# Patient Record
Sex: Male | Born: 1965 | Race: White | Hispanic: No | Smoking: Current every day smoker
Health system: Southern US, Community
[De-identification: ages and names within clinical notes are randomized; demographics above are authoritative.]

## PROBLEM LIST (undated history)

## (undated) DIAGNOSIS — K37 Unspecified appendicitis: Secondary | ICD-10-CM

## (undated) HISTORY — PX: KNEE ARTHROSCOPY: SUR90

## (undated) HISTORY — PX: APPENDECTOMY: SHX54

---

## 2014-01-07 ENCOUNTER — Emergency Department (HOSPITAL_BASED_OUTPATIENT_CLINIC_OR_DEPARTMENT_OTHER): Payer: Worker's Compensation

## 2014-01-07 ENCOUNTER — Encounter (HOSPITAL_BASED_OUTPATIENT_CLINIC_OR_DEPARTMENT_OTHER): Payer: Self-pay | Admitting: Emergency Medicine

## 2014-01-07 ENCOUNTER — Emergency Department (HOSPITAL_BASED_OUTPATIENT_CLINIC_OR_DEPARTMENT_OTHER)
Admission: EM | Admit: 2014-01-07 | Discharge: 2014-01-07 | Disposition: A | Discharge status: Home / Self Care | Payer: Worker's Compensation | Attending: Emergency Medicine | Admitting: Emergency Medicine

## 2014-01-07 DIAGNOSIS — S4980XA Other specified injuries of shoulder and upper arm, unspecified arm, initial encounter: Secondary | ICD-10-CM | POA: Insufficient documentation

## 2014-01-07 DIAGNOSIS — F17200 Nicotine dependence, unspecified, uncomplicated: Secondary | ICD-10-CM | POA: Insufficient documentation

## 2014-01-07 DIAGNOSIS — W010XXA Fall on same level from slipping, tripping and stumbling without subsequent striking against object, initial encounter: Secondary | ICD-10-CM | POA: Insufficient documentation

## 2014-01-07 DIAGNOSIS — M25512 Pain in left shoulder: Secondary | ICD-10-CM

## 2014-01-07 DIAGNOSIS — Y990 Civilian activity done for income or pay: Secondary | ICD-10-CM | POA: Insufficient documentation

## 2014-01-07 DIAGNOSIS — Z8719 Personal history of other diseases of the digestive system: Secondary | ICD-10-CM | POA: Insufficient documentation

## 2014-01-07 DIAGNOSIS — M25522 Pain in left elbow: Secondary | ICD-10-CM

## 2014-01-07 DIAGNOSIS — S6990XA Unspecified injury of unspecified wrist, hand and finger(s), initial encounter: Secondary | ICD-10-CM | POA: Insufficient documentation

## 2014-01-07 DIAGNOSIS — Y9289 Other specified places as the place of occurrence of the external cause: Secondary | ICD-10-CM | POA: Insufficient documentation

## 2014-01-07 DIAGNOSIS — S46909A Unspecified injury of unspecified muscle, fascia and tendon at shoulder and upper arm level, unspecified arm, initial encounter: Secondary | ICD-10-CM | POA: Insufficient documentation

## 2014-01-07 DIAGNOSIS — W19XXXA Unspecified fall, initial encounter: Secondary | ICD-10-CM

## 2014-01-07 DIAGNOSIS — S59919A Unspecified injury of unspecified forearm, initial encounter: Secondary | ICD-10-CM

## 2014-01-07 DIAGNOSIS — Y939 Activity, unspecified: Secondary | ICD-10-CM | POA: Insufficient documentation

## 2014-01-07 DIAGNOSIS — S59909A Unspecified injury of unspecified elbow, initial encounter: Secondary | ICD-10-CM | POA: Insufficient documentation

## 2014-01-07 HISTORY — DX: Unspecified appendicitis: K37

## 2014-01-07 MED ORDER — HYDROCODONE-ACETAMINOPHEN 5-325 MG PO TABS: 1.0000 | ORAL_TABLET | ORAL | Status: DC | PRN

## 2014-01-07 MED ORDER — IBUPROFEN 600 MG PO TABS: 600.0000 mg | ORAL_TABLET | Freq: Three times a day (TID) | ORAL | Status: DC | PRN

## 2014-01-07 MED ORDER — IBUPROFEN 200 MG PO TABS
600.0000 mg | ORAL_TABLET | Freq: Once | ORAL | Status: AC
  Administered 2014-01-07: 600 mg via ORAL
  Filled 2014-01-07 (×2): qty 1

## 2014-01-07 NOTE — ED Notes (Signed)
Pt works for [employer]. Was walking outside and slipped on ice, grabbed a lever with left arm, fell, and injured left arm. Pt reports pain in left clavicle area that radiates down left arm. Pt able to lift left arm to heart length with little difficulty. Pt alert and oriented x 4, neuro intact. Pt rates pain 5/10 when moving arm.

## 2014-01-07 NOTE — ED Notes (Signed)
Was falling and grab a bar to brake fall  inj to left upper and shoulder

## 2014-01-07 NOTE — ED Provider Notes (Signed)
CSN: [id number]     Arrival date & time 01/07/14  0354 History   First MD Initiated Contact with Patient 01/07/14 0359     Chief Complaint  Patient presents with  . Arm Pain     The history is provided by the patient.   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  He slipped on possible ice this evening resulting in a fall which point he attempted to catch himself with his left arm.  He states pain in his left elbow and pain in his left shoulder this time.  He can move his arm.  He denies head injury or neck pain.  No other complaints.  He denies knee or hip pain.  His pain is mild to moderate in severity at this time  Past Medical History  Diagnosis Date  . Appendicitis    Past Surgical History  Procedure Laterality Date  . Appendectomy    . Knee arthroscopy Right x 2   No family history on file. History  Substance Use Topics  . Smoking status: Current Every Day Smoker  . Smokeless tobacco: Not on file  . Alcohol Use: Not on file    Review of Systems  All other systems reviewed and are negative.      Allergies  Review of patient's allergies indicates no known allergies.  Home Medications   Current Outpatient Rx  Name  Route  Sig  Dispense  Refill  . HYDROcodone-acetaminophen (NORCO/VICODIN) 5-325 MG per tablet   Oral   Take 1 tablet by mouth every 4 (four) hours as needed for moderate pain.   15 tablet   0   . ibuprofen (ADVIL,MOTRIN) 600 MG tablet   Oral   Take 1 tablet (600 mg total) by mouth every 8 (eight) hours as needed.   15 tablet   0    BP 140/77  Pulse 73  Temp(Src) 98.5 F (36.9 C) (Oral)  Resp 18  Ht 5\' 8"  (1.727 m)  Wt 195 lb (88.451 kg)  BMI 29.66 kg/m2  SpO2 99% Physical Exam  Nursing note and vitals reviewed. Constitutional: He is oriented to person, place, and time. He appears well-developed and well-nourished.  HENT:  Head: Normocephalic and atraumatic.  Eyes: EOM are normal.  Neck: Normal range of motion.  Cardiovascular:  Normal rate.   Pulmonary/Chest: Effort normal.  Abdominal: Soft.  Musculoskeletal: Normal range of motion.  Full range of motion of left wrist.  Normal left radial pulse.  Patient with mild tenderness of his left radial head.  Mild pain located at the level of the elbow with supination of his left hand.  Patient with tenderness of his left a.c. joint without obvious deformity.  Mild pain with range of motion of his left shoulder.  No obvious clavicular abnormalities.  Neurological: He is alert and oriented to person, place, and time.  Skin: Skin is warm and dry.  Psychiatric: He has a normal mood and affect. Judgment normal.    ED Course  Procedures (including critical care time) Labs Review Labs Reviewed - No data to display Imaging Review Dg Elbow Complete Left  01/07/2014   CLINICAL DATA:  Slipped on ice; left arm pain.  EXAM: LEFT ELBOW - COMPLETE 3+ VIEW  COMPARISON:  None.  FINDINGS: There is no evidence of fracture or dislocation. Visualized joint spaces are preserved. A small osseous fragment overlying the olecranon may reflect remote injury or degenerative change. No elbow joint effusion is identified. The visualized soft tissues are  grossly unremarkable in appearance.  IMPRESSION: No evidence of fracture or dislocation.   Electronically Signed   By: Roanna RaiderJeffery  Chang M.D.   On: 01/07/2014 04:47   Dg Shoulder Left  01/07/2014   CLINICAL DATA:  Slipped on ice; left arm pain.  EXAM: LEFT SHOULDER - 2+ VIEW  COMPARISON:  None.  FINDINGS: There is no evidence of fracture or dislocation. The left humeral head is seated within the glenoid fossa. The acromioclavicular joint is unremarkable in appearance. No significant soft tissue abnormalities are seen. The visualized portions of the left lung are clear.  IMPRESSION: No evidence of fracture or dislocation.   Electronically Signed   By: Roanna RaiderJeffery  Chang M.D.   On: 01/07/2014 04:47  I personally reviewed the imaging tests through PACS system I  reviewed available ER/hospitalization records through the EMR   EKG Interpretation   None       MDM   Final diagnoses:  Left shoulder pain  Left elbow pain  Fall    Sports medicine follow up. Pain meds, nsaids. PRN shoulder sling    Lyanne CoKevin M Gargi Berch, MD 01/07/14 [phone]

## 2014-01-07 NOTE — ED Notes (Signed)
UDS Performed on Pt

## 2014-01-07 NOTE — ED Notes (Signed)
Patient transported to X-ray 

## 2014-01-09 ENCOUNTER — Ambulatory Visit (INDEPENDENT_AMBULATORY_CARE_PROVIDER_SITE_OTHER): Payer: Worker's Compensation | Admitting: Family Medicine

## 2014-01-09 ENCOUNTER — Encounter: Payer: Self-pay | Admitting: Family Medicine

## 2014-01-09 VITALS — BP 143/84 | HR 69 | Ht 68.0 in | Wt 192.0 lb

## 2014-01-09 DIAGNOSIS — M25512 Pain in left shoulder: Secondary | ICD-10-CM

## 2014-01-09 DIAGNOSIS — S4992XA Unspecified injury of left shoulder and upper arm, initial encounter: Secondary | ICD-10-CM

## 2014-01-09 DIAGNOSIS — S4980XA Other specified injuries of shoulder and upper arm, unspecified arm, initial encounter: Secondary | ICD-10-CM

## 2014-01-09 DIAGNOSIS — S46909A Unspecified injury of unspecified muscle, fascia and tendon at shoulder and upper arm level, unspecified arm, initial encounter: Secondary | ICD-10-CM

## 2014-01-09 DIAGNOSIS — M25519 Pain in unspecified shoulder: Secondary | ICD-10-CM

## 2014-01-09 MED ORDER — HYDROCODONE-ACETAMINOPHEN 5-325 MG PO TABS: 1.0000 | ORAL_TABLET | Freq: Four times a day (QID) | ORAL | Status: DC | PRN

## 2014-01-09 MED ORDER — MELOXICAM 15 MG PO TABS: 15.0000 mg | ORAL_TABLET | Freq: Every day | ORAL | Status: AC

## 2014-01-09 NOTE — Patient Instructions (Signed)
You have strained your rotator cuff. Try to avoid painful activities (overhead activities, lifting with extended arm) as much as possible. Meloxicam 15 mg daily with food for pain and inflammation (can start with half a tablet instead to make sure it doesn't irritate your stomach). Norco as needed for severe pain - we do not refill this medication. Can take tylenol in addition to this. Start physical therapy with transition to home exercise program. Light duty until follow-up in 2 weeks. If not improving at follow-up we will consider further imaging (MRI).

## 2014-01-13 ENCOUNTER — Encounter: Payer: Self-pay | Admitting: Family Medicine

## 2014-01-13 DIAGNOSIS — S4992XA Unspecified injury of left shoulder and upper arm, initial encounter: Secondary | ICD-10-CM | POA: Insufficient documentation

## 2014-01-13 NOTE — Assessment & Plan Note (Signed)
consistent with rotator cuff strain.  Meloxicam with norco as needed for severe pain.  Start physical therapy and home exercise program.  Light duty with follow-up in 2 weeks.  Consider MRI if not improving.

## 2014-01-13 NOTE — Progress Notes (Signed)
Patient ID: Brett Patel, male   DOB: 1966/11/10, 48 y.o.   MRN: [id number]  PCP: No PCP Per Patient  Subjective:   HPI: Patient is a 48 y.o. male here for left shoulder injury.  Patient reports at work 2 days ago 2/21 he went to open a trailer door and slipped on the ice. Fell onto left side onto wrist then shoulder. Difficulty lifting arm now. Is left handed. No swelling, bruising. + night pain. Taking ibuprofen and hydrocodone.  Past Medical History  Diagnosis Date  . Appendicitis     No current outpatient prescriptions on file prior to visit.   No current facility-administered medications on file prior to visit.    Past Surgical History  Procedure Laterality Date  . Appendectomy    . Knee arthroscopy Right x 2    No Known Allergies  History   Social History  . Marital Status: Married    Spouse Name: N/A    Number of Children: N/A  . Years of Education: N/A   Occupational History  . Not on file.   Social History Main Topics  . Smoking status: Current Every Day Smoker  . Smokeless tobacco: Not on file  . Alcohol Use: Not on file  . Drug Use: Not on file  . Sexual Activity: Not on file   Other Topics Concern  . Not on file   Social History Narrative  . No narrative on file    Family History  Problem Relation Age of Onset  . Heart attack Father   . Diabetes Sister   . Hyperlipidemia Brother     BP 143/84  Pulse 69  Ht 5\' 8"  (1.727 m)  Wt 192 lb (87.091 kg)  BMI 29.20 kg/m2  Review of Systems: See HPI above.    Objective:  Physical Exam:  Gen: NAD  Left shoulder: No swelling, ecchymoses.  No gross deformity. No TTP. Full ER.  Abduction to 90 degrees, flexion to 100 degrees, painful. Positive Hawkins, Neers. Negative Yergasons. Strength 4/5 with empty can and 5/5 resisted internal/external rotation.  Pain empty can > IR Negative apprehension. NV intact distally.    Assessment & Plan:  1. Left shoulder injury - consistent with  rotator cuff strain.  Meloxicam with norco as needed for severe pain.  Start physical therapy and home exercise program.  Light duty with follow-up in 2 weeks.  Consider MRI if not improving.

## 2014-01-17 ENCOUNTER — Ambulatory Visit: Payer: Worker's Compensation | Attending: Family Medicine | Admitting: Physical Therapy

## 2014-01-17 DIAGNOSIS — IMO0001 Reserved for inherently not codable concepts without codable children: Secondary | ICD-10-CM | POA: Insufficient documentation

## 2014-01-17 DIAGNOSIS — M25519 Pain in unspecified shoulder: Secondary | ICD-10-CM | POA: Insufficient documentation

## 2014-01-20 ENCOUNTER — Ambulatory Visit: Payer: Worker's Compensation | Admitting: Physical Therapy

## 2014-01-23 ENCOUNTER — Ambulatory Visit: Payer: Self-pay | Admitting: Family Medicine

## 2014-01-24 ENCOUNTER — Encounter: Payer: Self-pay | Admitting: Family Medicine

## 2014-01-24 ENCOUNTER — Ambulatory Visit (INDEPENDENT_AMBULATORY_CARE_PROVIDER_SITE_OTHER): Payer: Worker's Compensation | Admitting: Family Medicine

## 2014-01-24 ENCOUNTER — Ambulatory Visit: Payer: Worker's Compensation | Admitting: Physical Therapy

## 2014-01-24 VITALS — BP 115/76 | HR 68 | Ht 68.0 in | Wt 190.0 lb

## 2014-01-24 DIAGNOSIS — S4992XA Unspecified injury of left shoulder and upper arm, initial encounter: Secondary | ICD-10-CM

## 2014-01-24 DIAGNOSIS — S4980XA Other specified injuries of shoulder and upper arm, unspecified arm, initial encounter: Secondary | ICD-10-CM

## 2014-01-24 DIAGNOSIS — S46909A Unspecified injury of unspecified muscle, fascia and tendon at shoulder and upper arm level, unspecified arm, initial encounter: Secondary | ICD-10-CM

## 2014-01-25 ENCOUNTER — Encounter: Payer: Self-pay | Admitting: Family Medicine

## 2014-01-25 NOTE — Progress Notes (Signed)
Patient ID: Brett Patel, male   DOB: Jun 03, 1966, 48 y.o.   MRN: [id number]  PCP: No PCP Per Patient  Subjective:   HPI: Patient is a 48 y.o. male here for left shoulder injury.  2/23: Patient reports at work 2 days ago 2/21 he went to open a trailer door and slipped on the ice. Fell onto left side onto wrist then shoulder. Difficulty lifting arm now. Is left handed. No swelling, bruising. + night pain. Taking ibuprofen and hydrocodone.  3/10: Patient reports he is significantly better. Has done 4 visits of PT. Motion much improved. Doing home exercises. No pain currently.  Past Medical History  Diagnosis Date  . Appendicitis     Current Outpatient Prescriptions on File Prior to Visit  Medication Sig Dispense Refill  . escitalopram (LEXAPRO) 10 MG tablet Take 10 mg by mouth daily.      Marland Kitchen. HYDROcodone-acetaminophen (NORCO/VICODIN) 5-325 MG per tablet Take 1 tablet by mouth every 6 (six) hours as needed for moderate pain.  40 tablet  0  . lithium carbonate 150 MG capsule Take 150 mg by mouth 3 (three) times daily with meals.      . meloxicam (MOBIC) 15 MG tablet Take 1 tablet (15 mg total) by mouth daily. With food.  30 tablet  1   No current facility-administered medications on file prior to visit.    Past Surgical History  Procedure Laterality Date  . Appendectomy    . Knee arthroscopy Right x 2    No Known Allergies  History   Social History  . Marital Status: Married    Spouse Name: N/A    Number of Children: N/A  . Years of Education: N/A   Occupational History  . Not on file.   Social History Main Topics  . Smoking status: Current Every Day Smoker  . Smokeless tobacco: Not on file  . Alcohol Use: Not on file  . Drug Use: Not on file  . Sexual Activity: Not on file   Other Topics Concern  . Not on file   Social History Narrative  . No narrative on file    Family History  Problem Relation Age of Onset  . Heart attack Father   . Diabetes  Sister   . Hyperlipidemia Brother     BP 115/76  Pulse 68  Ht 5\' 8"  (1.727 m)  Wt 190 lb (86.183 kg)  BMI 28.90 kg/m2  Review of Systems: See HPI above.    Objective:  Physical Exam:  Gen: NAD  Left shoulder: No swelling, ecchymoses.  No gross deformity. No TTP. FROM without pain. Negative Hawkins, Neers. Negative Yergasons. Strength 5/5 with empty can and 5/5 resisted internal/external rotation.  No pain. Negative apprehension. NV intact distally.    Assessment & Plan:  1. Left shoulder injury - consistent with rotator cuff strain.  Significantly improved.  Continue HEP another 4 weeks.  Return to full duty after last PT visit Thursday (can return on Friday to work).  F/u prn.

## 2014-01-25 NOTE — Assessment & Plan Note (Signed)
consistent with rotator cuff strain.  Significantly improved.  Continue HEP another 4 weeks.  Return to full duty after last PT visit Thursday (can return on Friday to work).  F/u prn.

## 2014-01-26 ENCOUNTER — Ambulatory Visit: Payer: Worker's Compensation | Admitting: Physical Therapy

## 2014-01-30 ENCOUNTER — Ambulatory Visit: Payer: Worker's Compensation | Admitting: Physical Therapy

## 2014-02-01 ENCOUNTER — Ambulatory Visit: Payer: Worker's Compensation | Admitting: Physical Therapy

## 2014-02-06 ENCOUNTER — Ambulatory Visit: Payer: Worker's Compensation | Admitting: Physical Therapy

## 2014-02-09 ENCOUNTER — Encounter: Payer: Self-pay | Admitting: Physical Therapy

## 2014-05-06 ENCOUNTER — Ambulatory Visit (HOSPITAL_COMMUNITY)
Admission: AD | Admit: 2014-05-06 | Discharge: 2014-05-06 | Disposition: A | Discharge status: Home / Self Care | Payer: 59 | Attending: Psychiatry | Admitting: Psychiatry

## 2014-05-06 DIAGNOSIS — F319 Bipolar disorder, unspecified: Secondary | ICD-10-CM | POA: Insufficient documentation

## 2014-05-06 DIAGNOSIS — F17200 Nicotine dependence, unspecified, uncomplicated: Secondary | ICD-10-CM | POA: Insufficient documentation
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
## 2014-07-15 IMAGING — CR DG SHOULDER 2+V*L*
3 series · 3 of 3 positions shown · non-contrast
Comparison: None.

CLINICAL DATA: Slipped on ice; left arm pain.

EXAM:
LEFT SHOULDER - 2+ VIEW

[w shoulder ap internal left]
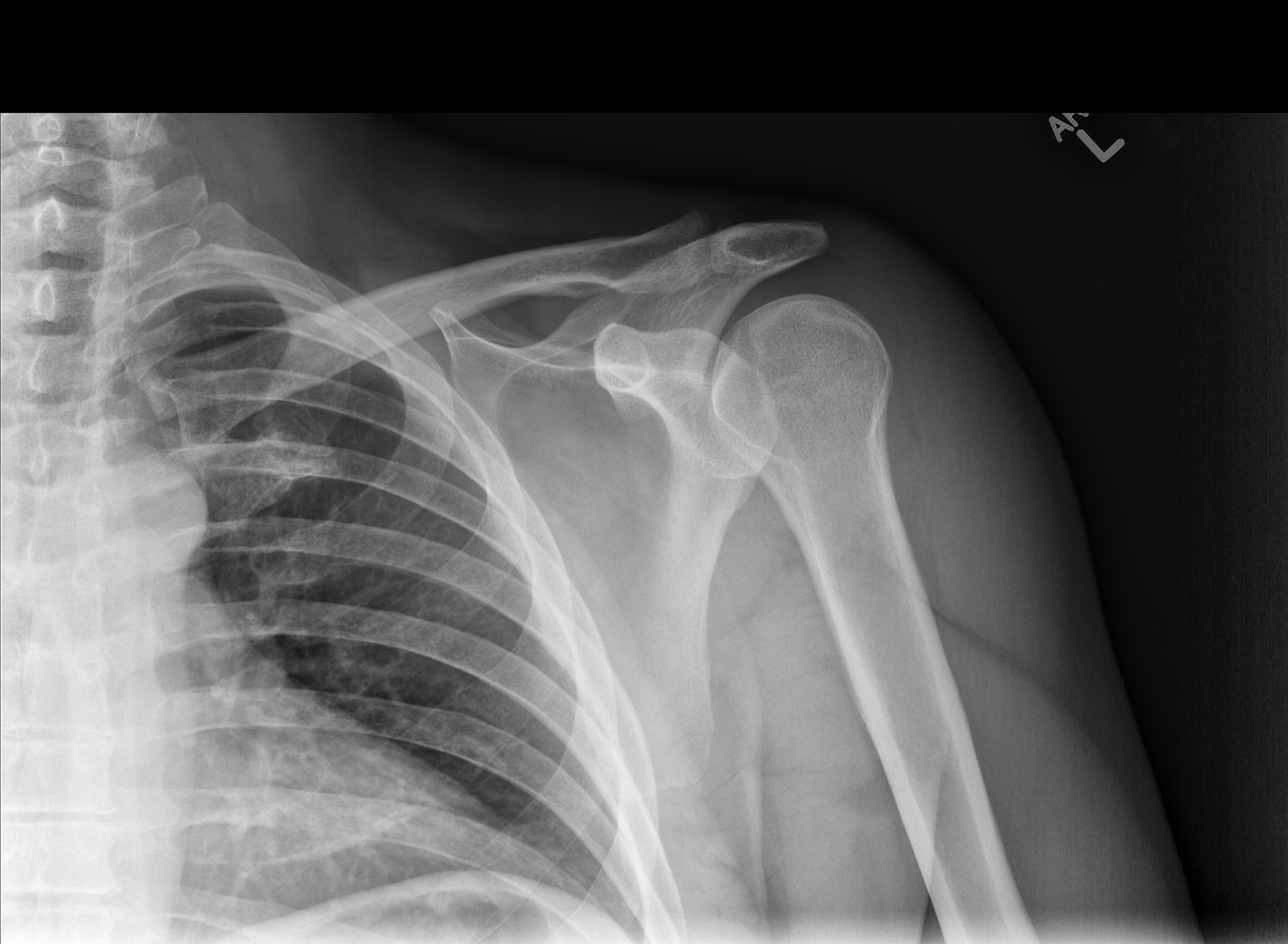

[w shoulder y view left]
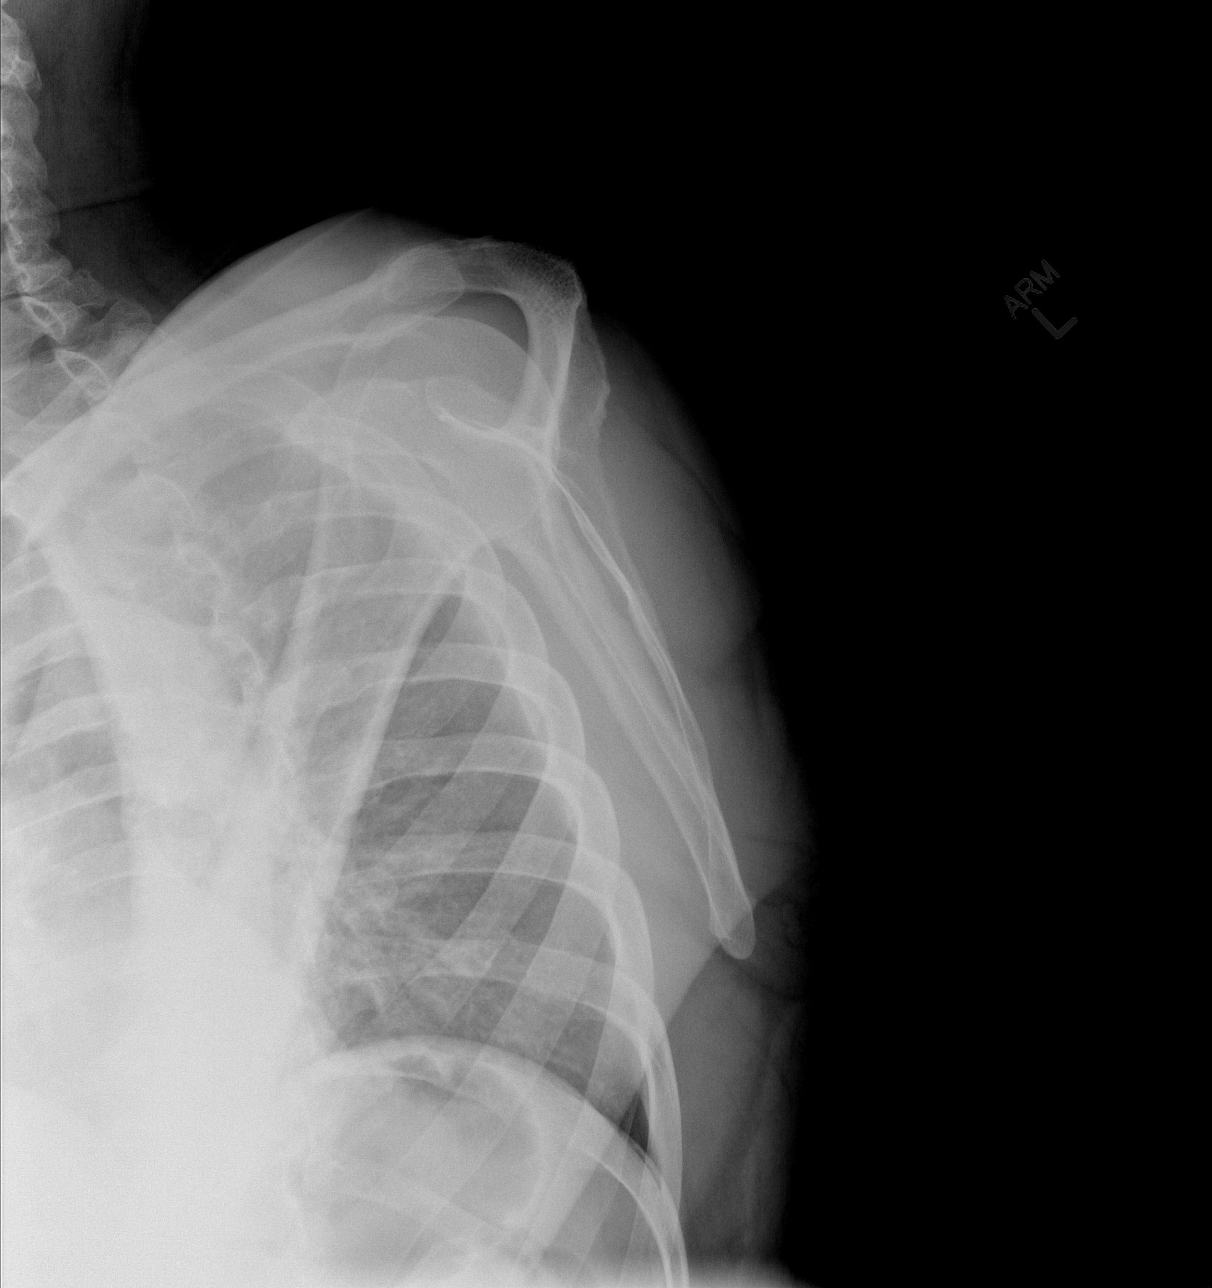

[x shoulder axillary left]
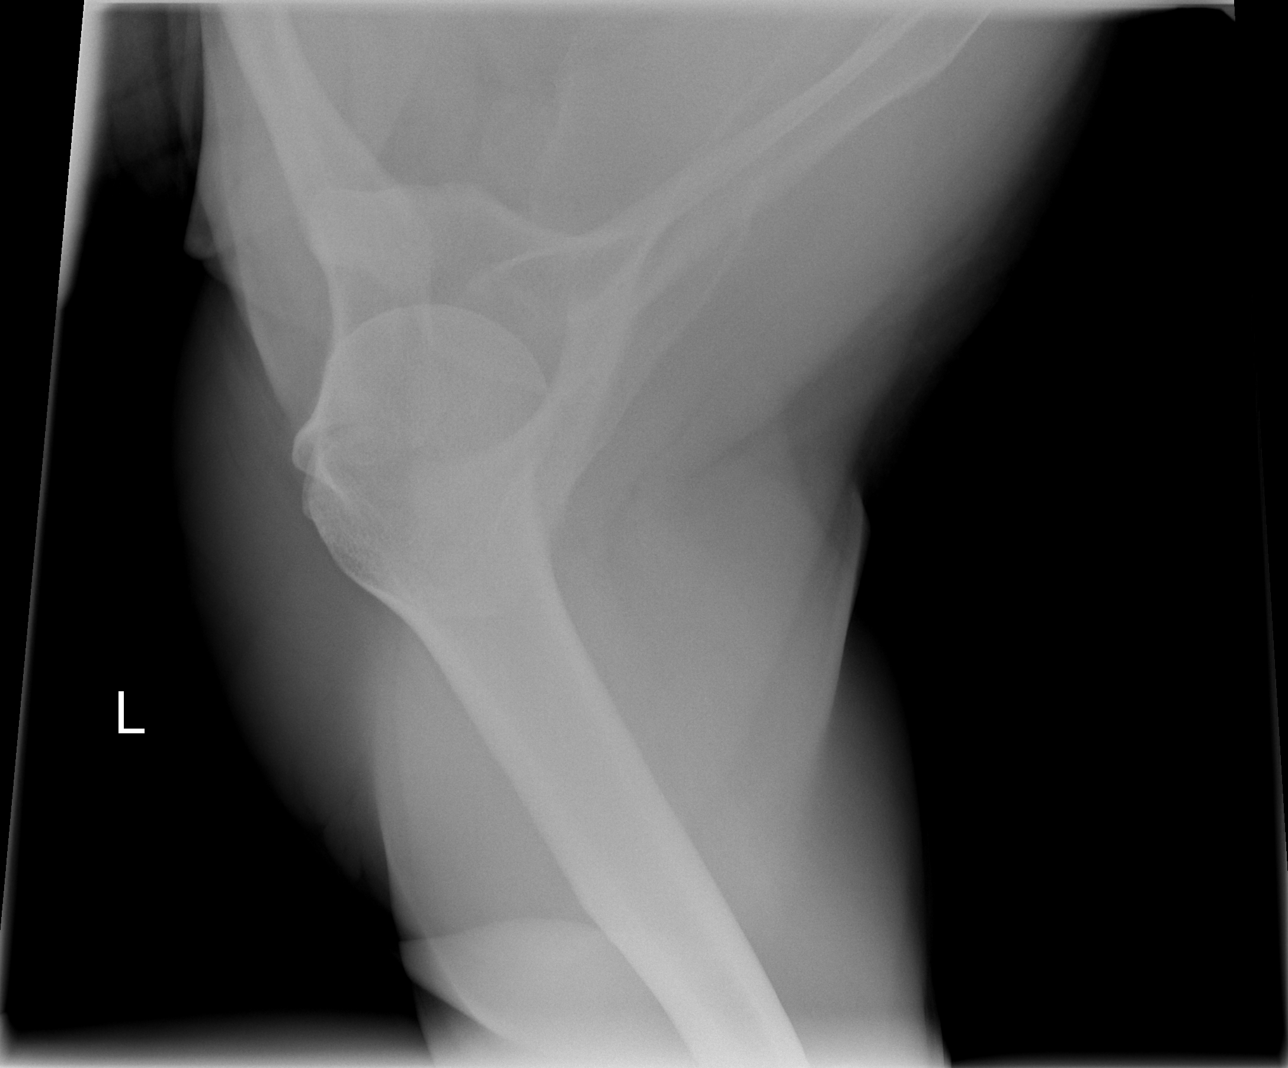

[3 of 3 positions shown; findings below may reference images not displayed]

FINDINGS: There is no evidence of fracture or dislocation. The left humeral
head is seated within the glenoid fossa. The acromioclavicular joint
is unremarkable in appearance. No significant soft tissue
abnormalities are seen. The visualized portions of the left lung are
clear.
IMPRESSION: No evidence of fracture or dislocation.

## 2014-07-15 IMAGING — CR DG ELBOW COMPLETE 3+V*L*
4 series · 4 of 4 positions shown · non-contrast
Comparison: None.

CLINICAL DATA: Slipped on ice; left arm pain.

EXAM:
LEFT ELBOW - COMPLETE 3+ VIEW

[x elbow joint ap left]
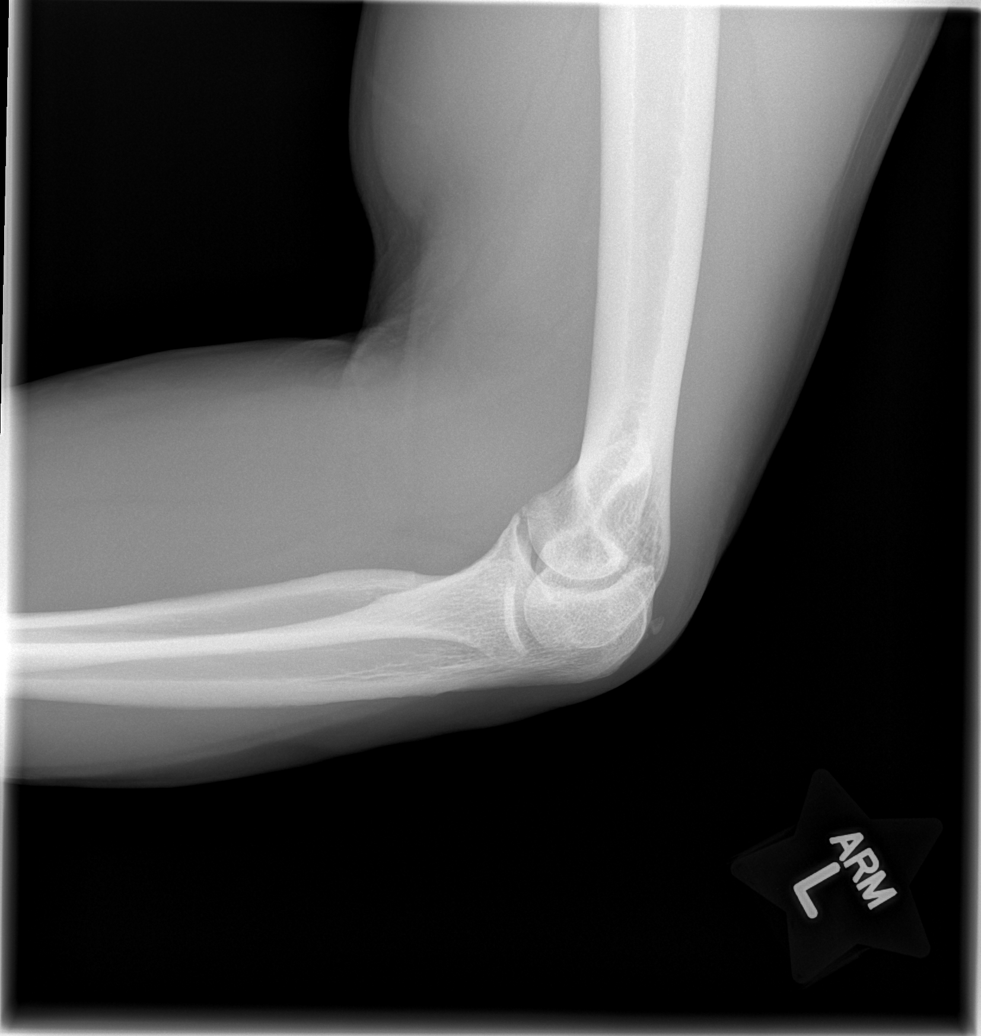

[x elbow joint obl. left (1 of 2)]
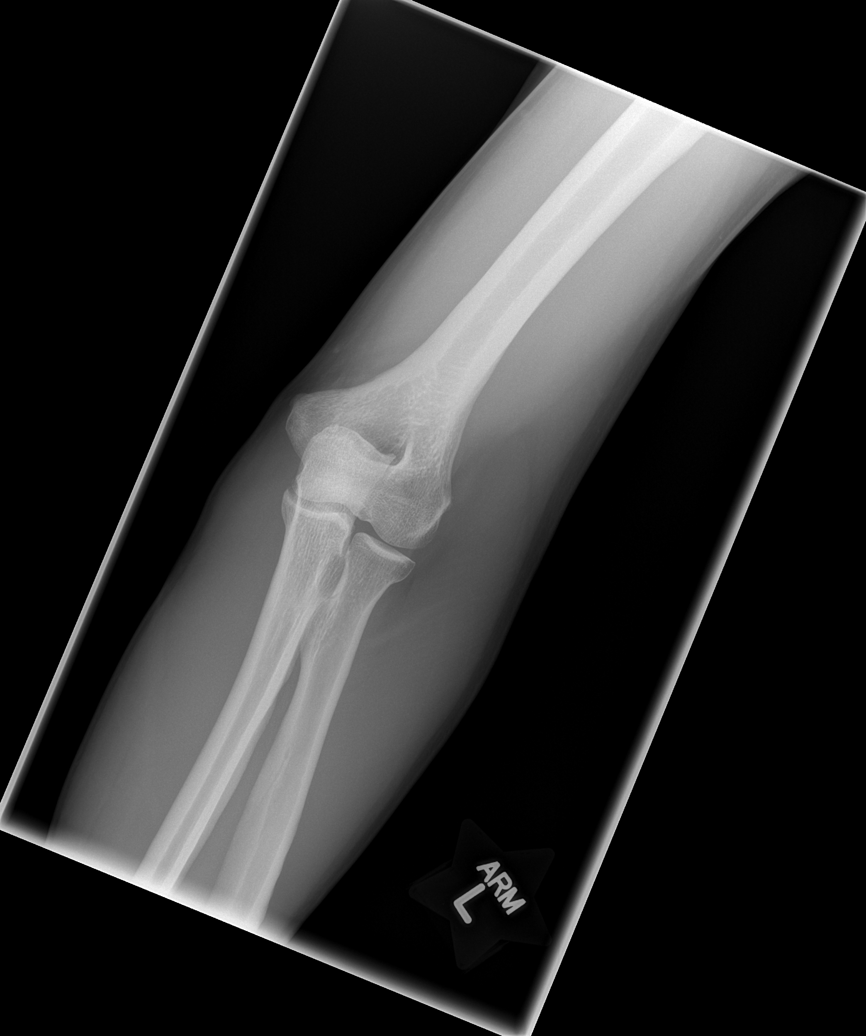

[x elbow joint obl. left (2 of 2)]
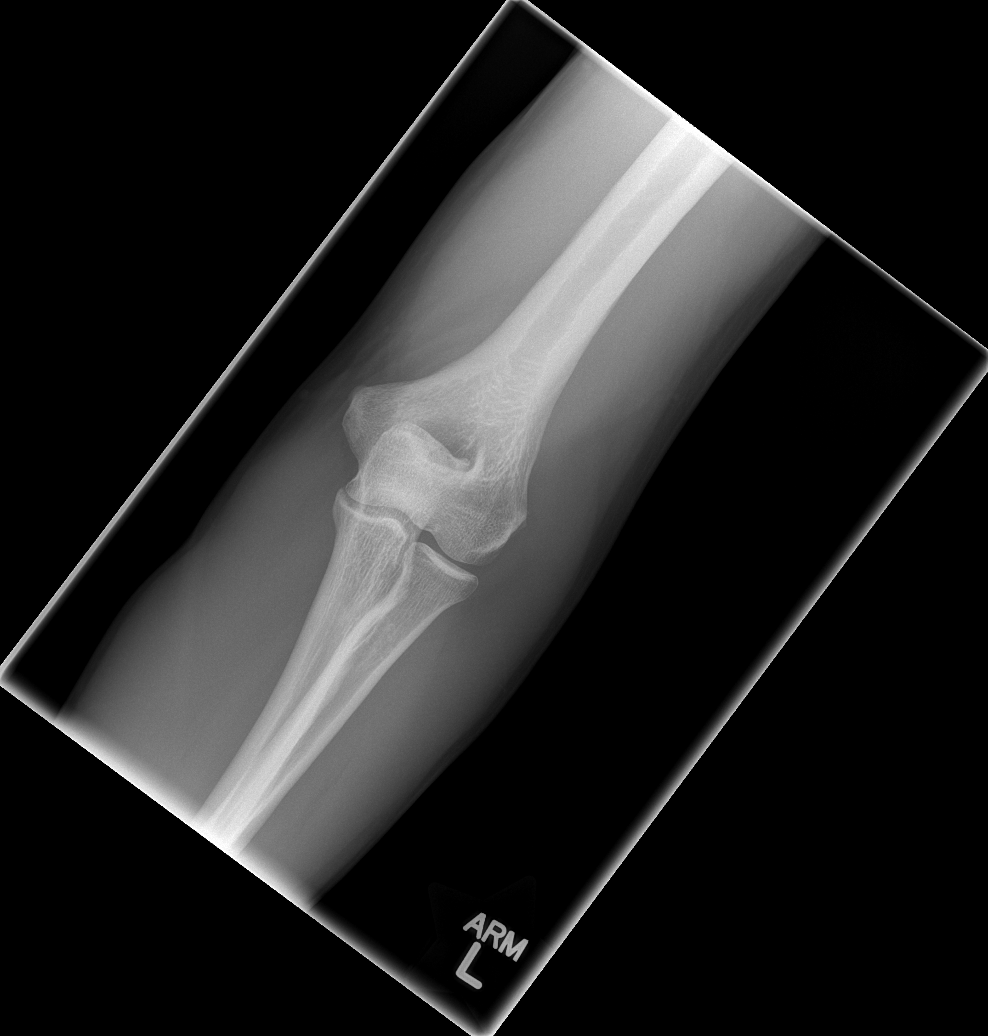

[x elbow joint lat left]
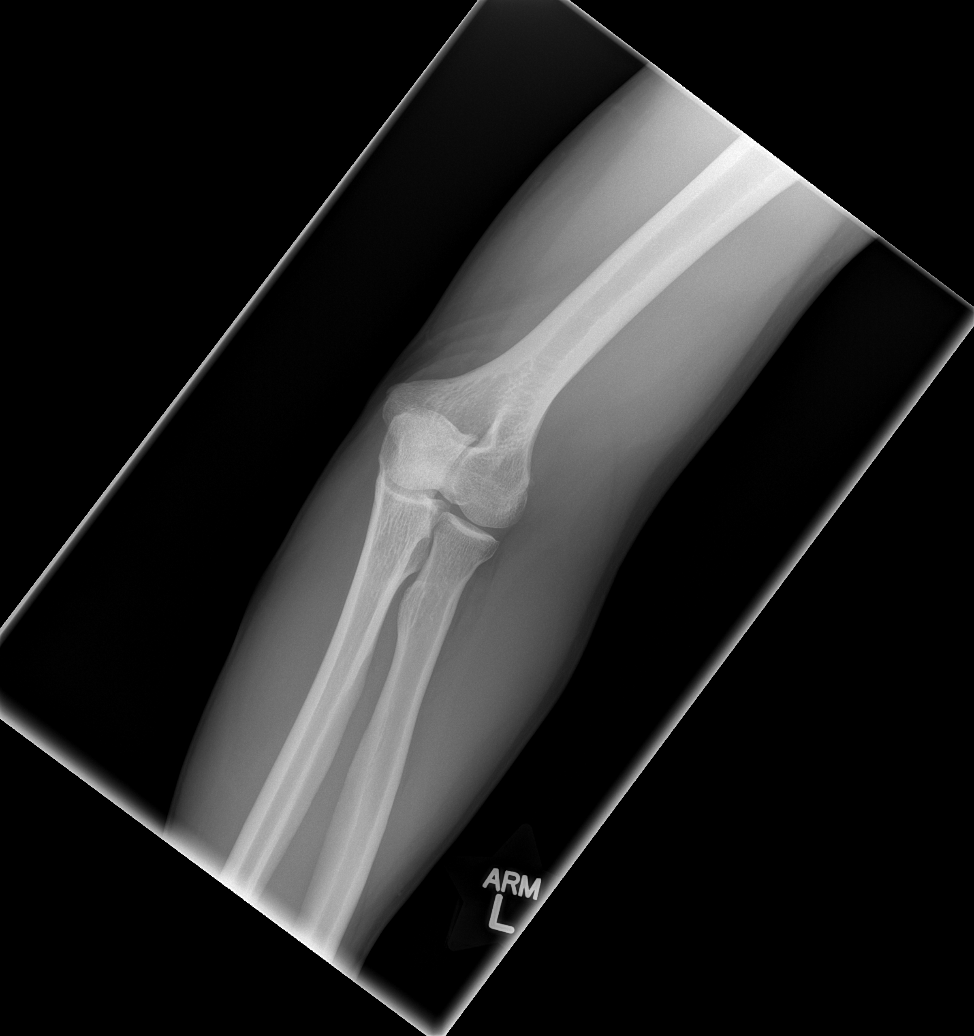

[4 of 4 positions shown; findings below may reference images not displayed]

FINDINGS: There is no evidence of fracture or dislocation. Visualized joint
spaces are preserved. A small osseous fragment overlying the
olecranon may reflect remote injury or degenerative change. No elbow
joint effusion is identified. The visualized soft tissues are
grossly unremarkable in appearance.
IMPRESSION: No evidence of fracture or dislocation.

## 2016-04-06 ENCOUNTER — Emergency Department (HOSPITAL_BASED_OUTPATIENT_CLINIC_OR_DEPARTMENT_OTHER)
Admission: EM | Admit: 2016-04-06 | Discharge: 2016-04-06 | Disposition: A | Discharge status: Home / Self Care | Payer: 59 | Attending: Emergency Medicine | Admitting: Emergency Medicine

## 2016-04-06 ENCOUNTER — Encounter (HOSPITAL_BASED_OUTPATIENT_CLINIC_OR_DEPARTMENT_OTHER): Payer: Self-pay

## 2016-04-06 DIAGNOSIS — F17200 Nicotine dependence, unspecified, uncomplicated: Secondary | ICD-10-CM | POA: Diagnosis not present

## 2016-04-06 DIAGNOSIS — M5441 Lumbago with sciatica, right side: Secondary | ICD-10-CM | POA: Diagnosis not present

## 2016-04-06 DIAGNOSIS — R110 Nausea: Secondary | ICD-10-CM | POA: Diagnosis not present

## 2016-04-06 DIAGNOSIS — M25512 Pain in left shoulder: Secondary | ICD-10-CM

## 2016-04-06 DIAGNOSIS — M545 Low back pain: Secondary | ICD-10-CM | POA: Diagnosis present

## 2016-04-06 MED ORDER — METHOCARBAMOL 500 MG PO TABS: 500.0000 mg | ORAL_TABLET | Freq: Two times a day (BID) | ORAL | Status: AC

## 2016-04-06 MED ORDER — KETOROLAC TROMETHAMINE 15 MG/ML IJ SOLN
15.0000 mg | Freq: Once | INTRAMUSCULAR | Status: AC
  Administered 2016-04-06: 15 mg via INTRAMUSCULAR
  Filled 2016-04-06: qty 1

## 2016-04-06 MED ORDER — HYDROCODONE-ACETAMINOPHEN 5-325 MG PO TABS: 1.0000 | ORAL_TABLET | Freq: Four times a day (QID) | ORAL | Status: AC | PRN

## 2016-04-06 NOTE — ED Notes (Signed)
Pt reports R side back pain radiating down R leg x 1 week. 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 Pt has taken ibuprofen without relief. Pt had a fall last Wednesday, unsure if it is related.

## 2016-04-06 NOTE — ED Notes (Signed)
Pt reports lower back pain, radiating down right leg. Reports fall on Wednesday with no injury.

## 2016-04-06 NOTE — Discharge Instructions (Signed)
Medications: Norco, Robaxin  Treatment: Take Norco every 6 hours as needed for severe pain. Take Robaxin twice daily as needed for muscle pain and spasm. Do not drive or operate machinery while taking this medication. Use moist heat 3-4 times daily alternating 20 minutes on, 20 minutes off. Do the back exercises and stretches indicated below as tolerated.  Follow-up: Please follow-up with the spine specialist as indicated in your discharge paperwork for further evaluation and treatment of your symptoms. Please follow-up with her primary care provider as needed. Please return to emergency department if you develop any new or worsening symptoms.    Back Exercises The following exercises strengthen the muscles that help to support the back. They also help to keep the lower back flexible. Doing these exercises can help to prevent back pain or lessen existing pain. If you have back pain or discomfort, try doing these exercises 2-3 times each day or as told by your health care provider. When the pain goes away, do them once each day, but increase the number of times that you repeat the steps for each exercise (do more repetitions). If you do not have back pain or discomfort, do these exercises once each day or as told by your health care provider. EXERCISES Single Knee to Chest Repeat these steps 3-5 times for each leg: 1. Lie on your back on a firm bed or the floor with your legs extended. 2. Bring one knee to your chest. Your other leg should stay extended and in contact with the floor. 3. Hold your knee in place by grabbing your knee or thigh. 4. Pull on your knee until you feel a gentle stretch in your lower back. 5. Hold the stretch for 10-30 seconds. 6. Slowly release and straighten your leg. Pelvic Tilt Repeat these steps 5-10 times: 1. Lie on your back on a firm bed or the floor with your legs extended. 2. Bend your knees so they are pointing toward the ceiling and your feet are flat on the  floor. 3. Tighten your lower abdominal muscles to press your lower back against the floor. This motion will tilt your pelvis so your tailbone points up toward the ceiling instead of pointing to your feet or the floor. 4. With gentle tension and even breathing, hold this position for 5-10 seconds. Cat-Cow Repeat these steps until your lower back becomes more flexible: 1. Get into a hands-and-knees position on a firm surface. Keep your hands under your shoulders, and keep your knees under your hips. You may place padding under your knees for comfort. 2. Let your head hang down, and point your tailbone toward the floor so your lower back becomes rounded like the back of a cat. 3. Hold this position for 5 seconds. 4. Slowly lift your head and point your tailbone up toward the ceiling so your back forms a sagging arch like the back of a cow. 5. Hold this position for 5 seconds. Press-Ups Repeat these steps 5-10 times: 1. Lie on your abdomen (face-down) on the floor. 2. Place your palms near your head, about shoulder-width apart. 3. While you keep your back as relaxed as possible and keep your hips on the floor, slowly straighten your arms to raise the top half of your body and lift your shoulders. Do not use your back muscles to raise your upper torso. You may adjust the placement of your hands to make yourself more comfortable. 4. Hold this position for 5 seconds while you keep your back relaxed.  5. Slowly return to lying flat on the floor. Bridges Repeat these steps 10 times: 1. Lie on your back on a firm surface. 2. Bend your knees so they are pointing toward the ceiling and your feet are flat on the floor. 3. Tighten your buttocks muscles and lift your buttocks off of the floor until your waist is at almost the same height as your knees. You should feel the muscles working in your buttocks and the back of your thighs. If you do not feel these muscles, slide your feet 1-2 inches farther away from  your buttocks. 4. Hold this position for 3-5 seconds. 5. Slowly lower your hips to the starting position, and allow your buttocks muscles to relax completely. If this exercise is too easy, try doing it with your arms crossed over your chest. Abdominal Crunches Repeat these steps 5-10 times: 1. Lie on your back on a firm bed or the floor with your legs extended. 2. Bend your knees so they are pointing toward the ceiling and your feet are flat on the floor. 3. Cross your arms over your chest. 4. Tip your chin slightly toward your chest without bending your neck. 5. Tighten your abdominal muscles and slowly raise your trunk (torso) high enough to lift your shoulder blades a tiny bit off of the floor. Avoid raising your torso higher than that, because it can put too much stress on your low back and it does not help to strengthen your abdominal muscles. 6. Slowly return to your starting position. Back Lifts Repeat these steps 5-10 times: 1. Lie on your abdomen (face-down) with your arms at your sides, and rest your forehead on the floor. 2. Tighten the muscles in your legs and your buttocks. 3. Slowly lift your chest off of the floor while you keep your hips pressed to the floor. Keep the back of your head in line with the curve in your back. Your eyes should be looking at the floor. 4. Hold this position for 3-5 seconds. 5. Slowly return to your starting position. SEEK MEDICAL CARE IF:  Your back pain or discomfort gets much worse when you do an exercise.  Your back pain or discomfort does not lessen within 2 hours after you exercise. If you have any of these problems, stop doing these exercises right away. Do not do them again unless your health care provider says that you can. SEEK IMMEDIATE MEDICAL CARE IF:  You develop sudden, severe back pain. If this happens, stop doing the exercises right away. Do not do them again unless your health care provider says that you can.   This information  is not intended to replace advice given to you by your health care provider. Make sure you discuss any questions you have with your health care provider.   Document Released: 12/11/2004 Document Revised: 07/25/2015 Document Reviewed: 12/28/2014 Elsevier Interactive Patient Education [employer]

## 2016-04-06 NOTE — ED Provider Notes (Signed)
CSN: [id number]     Arrival date & time 04/06/16  1522 History  By signing my name below, I, Ronney Lion, attest that this documentation has been prepared under the direction and in the presence of Buel Ream, PA-C. Electronically Signed: Ronney Lion, ED Scribe. 04/06/2016. 6:44 PM.    Chief Complaint  Patient presents with  . Back Pain   The history is provided by the spouse and the patient. No language interpreter was used.   HPI Comments: Brett Patel is a 50 y.o. male with a history of ruptured L5 disc, who presents to the Emergency Department complaining of constant right-sided lower back pain radiating down his right leg. He also complains of associated nausea secondary to his pain and intermittent paresthesia in his bilateral feet, particularly at night. 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 He states he felt no immediate significant pain following the accident. He reports a history of a ruptured L5 disc, diagnosed with an MRI following a traumatic event 1 year ago. He states the accident caused a cervical spine fracture that was repaired surgically in 04/2015. He states his neurosurgeon had also offered to repair his ruptured disc, but patient had declined at the time. Patient states he had been doing well following his surgery. Patient states he has tried hot baths, ibuprofen, and massage; all with no relief. Certain positions, including bending forward, exacerbate his pain. He denies any left-sided back pain. He denies a history of IVDU or cancer. He denies chest pain, SOB, abdominal pain, vomiting, saddle anesthesia, dysuria, difficulty urinating, bowel or bladder incontinence, fever, or unexpected weight changes. He denies a history of kidney problems.  Patient's wife requests a Toradol injection, which she states she was given when she was last seen in the ED for sciatica.  Past  Medical History  Diagnosis Date  . Appendicitis    Past Surgical History  Procedure Laterality Date  . Appendectomy    . Knee arthroscopy Right x 2   Family History  Problem Relation Age of Onset  . Heart attack Father   . Diabetes Sister   . Hyperlipidemia Brother    Social History  Substance Use Topics  . Smoking status: Current Every Day Smoker  . Smokeless tobacco: None  . Alcohol Use: None    Review of Systems  Constitutional: Negative for fever, chills and unexpected weight change.  HENT: Negative for facial swelling and sore throat.   Respiratory: Negative for shortness of breath.   Cardiovascular: Negative for chest pain.  Gastrointestinal: Positive for nausea. Negative for vomiting and abdominal pain.  Genitourinary: Negative for dysuria and difficulty urinating.  Musculoskeletal: Positive for back pain.  Skin: Negative for rash and wound.  Neurological: Negative for numbness and headaches.       Positive for tingling in bilateral feet.   Psychiatric/Behavioral: The patient is not nervous/anxious.       Allergies  Review of patient's allergies indicates no known allergies.  Home Medications   Prior to Admission medications   Medication Sig Start Date End Date Taking? Authorizing Provider  escitalopram (LEXAPRO) 10 MG tablet Take 10 mg by mouth daily.    Historical Provider, MD  HYDROcodone-acetaminophen (NORCO/VICODIN) 5-325 MG tablet Take 1 tablet by mouth every 6 (six) hours as needed for moderate pain. 04/06/16   Emi Holes, PA-C  lithium carbonate 150 MG capsule Take 150 mg by  mouth 3 (three) times daily with meals.    Historical Provider, MD  meloxicam (MOBIC) 15 MG tablet Take 1 tablet (15 mg total) by mouth daily. With food. 01/09/14   Lenda KelpShane R Hudnall, MD  methocarbamol (ROBAXIN) 500 MG tablet Take 1 tablet (500 mg total) by mouth 2 (two) times daily. 04/06/16   Lamar Naef M Gyan Cambre, PA-C   BP 125/87 mmHg  Pulse 60  Temp(Src) 98.2 F (36.8 C) (Oral)   Resp 18  Ht 5\' 8"  (1.727 m)  Wt 86.183 kg  BMI 28.90 kg/m2  SpO2 98% Physical Exam  Constitutional: He appears well-developed and well-nourished. No distress.  HENT:  Head: Normocephalic and atraumatic.  Mouth/Throat: Oropharynx is clear and moist. No oropharyngeal exudate.  Eyes: Conjunctivae are normal. Pupils are equal, round, and reactive to light. Right eye exhibits no discharge. Left eye exhibits no discharge. No scleral icterus.  Neck: Normal range of motion. Neck supple. No thyromegaly present.  Cardiovascular: Normal rate, regular rhythm, normal heart sounds and intact distal pulses.  Exam reveals no gallop and no friction rub.   No murmur heard. Pulmonary/Chest: Effort normal and breath sounds normal. No stridor. No respiratory distress. He has no wheezes. He has no rales.  Abdominal: Soft. Bowel sounds are normal. He exhibits no distension. There is no tenderness. There is no rebound and no guarding.  Musculoskeletal: He exhibits no edema.       Lumbar back: He exhibits tenderness. He exhibits no bony tenderness.       Back:  No midline bony tenderness; patient can heel raise and toe raise; patient's pain worsened with flexion, no pain with extension or lateral flexion  Lymphadenopathy:    He has no cervical adenopathy.  Neurological: He is alert. Coordination normal.  Normal sensation to bilateral lower extremities; 5/5 strength in bilateral lower extremities; positive SLR right leg, negative SLR in left leg  Skin: Skin is warm and dry. No rash noted. He is not diaphoretic. No pallor.  Psychiatric: He has a normal mood and affect.  Nursing note and vitals reviewed.   ED Course  Procedures (including critical care time)  DIAGNOSTIC STUDIES: Oxygen Saturation is 100% on RA, normal by my interpretation.    COORDINATION OF CARE: 6:12 PM - Discussed treatment plan with pt at bedside. Pt verbalized understanding and agreed to plan.  MDM   Patient with back pain.  History of ruptured lumbar disc (L5). No neurological deficits and normal neuro exam.  Patient can walk but states is painful.  No loss of bowel or bladder control.  No concern for cauda equina.  No fever, night sweats, weight loss, h/o cancer, IVDU. Toradol injection administered per pt's wife's request. No history of kidney problems. RICE protocol and Norco and Robaxin indicated prn and discussed with patient.  Advised Tylenol and/or Aleve.  Advised applying moist heat.  Advised pt to follow up with neurosurgeon to discuss further treatment options.  Strict return precautions discussed.Patient discharged in satisfactory condition.  Final diagnoses:  Right-sided low back pain with right-sided sciatica    I personally performed the services described in this documentation, which was scribed in my presence. The recorded information has been reviewed and is accurate.     Emi Holeslexandra M Danner Paulding, PA-C 04/07/16 0141  Arby BarretteMarcy Pfeiffer, MD 04/08/16 [phone]

## 2024-08-02 NOTE — ED Provider Notes (Signed)
 CDU OBSERVATION - CHEST PAIN Observation Start Time: 0126 on 08/02/2024  Brett Patel was seen on morning rounds in the CDU.  Shared service with Dr. Carlin [place].  Brett Patel was appropriately risk stratified for observation care requiring observation for cardiac monitoring, evaluation for progression of symptoms and provocative testing.    History of Present Illness Brett Patel 58 y.o. year old male with PMHx significant for Hypertension, Hyperlipidemia, Pericarditis, Tobacco Use Disorder, ETOH Use Disorder in Remission GERD, Asthma, COPD, Migraines, Bipolar I, Panic Attacks, GAD, MDD, Memory Problem, Neuroforaminal Stenosis of Cervical Spine, Lumbar Radiculopathy, DDD who presented to the ED with c/o   Recent Hospital Encounters / Office Visit(s):  01/06/2024 - ED visit for cough, congestion, abdominal pain and bodyaches x 4 days. Pt tested positive for influenza and had some wheezes. 4-day course of steroids and DC home.   12/27/2023 thru 12/30/2023 - Admission for suicide attempt, cutting his left wrist in the setting of recent alcohol relapse and EtOH intoxication.  Patient placed in the [facility] unit for detox and med management. DC home and OP [facility] referral.   Medical History[1] Family History[2] Social History[3]  Review of Systems PMH, SH, home medications, allergies confirmed with patient. PDMP and Epic notes reviewed.  Patient denies any chest pain, shortness of breath at rest or on exertion, palpitations, cough, fever, chills, dizziness, syncope, abdominal pain, dysuric symptoms, nausea, vomiting or diarrhea.  Physical Examination Vitals: BP 140/70 (BP Location: Right arm, Patient Position: Lying)   Pulse 67   Temp 98.1 F (36.7 C) (Oral)   Resp 18   Ht 170.2 cm (5' 7)   Wt 92.8 kg (204 lb 9.6 oz)   SpO2 98%   BMI 32.04 kg/m  Physical Exam Vitals and nursing note reviewed.  Constitutional:      General: He is not in acute distress.    Appearance: Normal appearance.  He is not ill-appearing.  HENT:     Head: Normocephalic and atraumatic.     Nose: Nose normal.  Eyes:     General: No scleral icterus.    Extraocular Movements: Extraocular movements intact.  Neck:     Vascular: No JVD.  Cardiovascular:     Rate and Rhythm: Normal rate and regular rhythm.     Pulses:          Radial pulses are 2+ on the right side and 2+ on the left side.     Heart sounds: Normal heart sounds. No murmur heard. Pulmonary:     Effort: Pulmonary effort is normal.     Breath sounds: Normal breath sounds.  Abdominal:     General: Bowel sounds are normal.     Palpations: Abdomen is soft.  Musculoskeletal:     Right lower leg: No edema.     Left lower leg: No edema.  Skin:    General: Skin is warm and dry.  Neurological:     Mental Status: He is alert and oriented to person, place, and time.  Psychiatric:        Mood and Affect: Mood normal. Mood is not anxious.        Behavior: Behavior normal.        Thought Content: Thought content normal.        Judgment: Judgment normal.    Medical Decision Making  Differential diagnoses includes but is not limited to: ACS, angina, aortic dissection, pneumonia, PE, pneumothorax, musculoskeletal pain, cardiomyopathy, bronchitis, COPD/asthma, CHF, dyspepsia, gastric ulcer disease, esophagitis,  pleural effusion, sarcoidosis, TB, COVID, endocarditis, myocarditis, anxiety, hypertensive emergency, valvular disease, STEMI, NSTEMI.  Diagnostics CXR Impression:  There is no evidence of acute cardiac or pulmonary abnormality.   ECG:  Performed at 0806 on 08/02/2024 - HR 81, Sinus rhythm Cannot rule out Anterior infarct , age undetermined When compared with ECG of 06-Jan-2024 10 35, No significant change was found Confirmed by Rojelio Dunnings  [phone]  on 08-02-2024 8 06 41 A   NM Myocardial Perfusion Stress & Rest Impression:  1.) LIMITATIONS: None. 2.) MYOCARDIAL PERFUSION: Normal. 3.) LEFT VENTRICULAR EJECTION FRACTION: Normal. 4.) REGIONAL  WALL MOTION: Normal.  CTA Chest Impression: 1.  No pulmonary embolism. 2.  Mild bronchial wall thickening is nonspecific and may be infectious or inflammatory in etiology. No airspace consolidation.  CT Abdomen Pelvis: 1.  Nonspecific bladder wall thickening. Correlate with urinalysis to assess for acute cystitis. 2.  Otherwise, no acute abdominopelvic abnormality. 3.  Chronic ancillary findings as above.  HEAR 4.  Cardiac troponin 4, 4.  BNP 14 and ECG shows a SR. CBC is unremarkable.  CMP confirms no electrolyte derangements, slightly elevated at 112, and shows normal renal and hepatic function. Magnesium 2.1.  Lipase 14.  TSH 3.6. D-Dimer 300. SARS-COV-2/Influenza A&B / RSV negative. Lipid Panel: CHOL 186, TRI 156, HDL 61, LDL 100.   There are no findings to suggest an atypical pneumonia as CXR is normal, patient remains afebrile without leukocytosis, denies any cough and on exam no rhonchi or absent breath sounds noted. Do not suspect HF as CXR is negative for pleural effusions, pulmonary edema, BNP is normal and on exam no rales heard or edema noted. PE is not suspected as with normal SpO2 level, D Dimer is negative, patient is not tachycardic, denies any pleuritic chest pain and CTA negative.     Assessment & Plan  Symptoms c/w chest pain possibly secondary to cardiac vs gastrointestinal vs pulmonary vs musculoskeletal vs psychiatric etiology. The patient is currently in observation status in order to determine necessity of potential hospital admission.  The patient presentation is consistent with acute presentation with potential threat to life or bodily function due to concern for ACS.     Chest Pain Patient will remain on continuous cardiac and pulse oximetry monitoring.   ASA 325 given in ED  Patient will be permitted a cardiac diet and then have nothing by mouth after midnight in anticipation of provocative testing in AM.   Chronic Muscle Cramps Magnesium and Diazepam given in ED, check  Mg level.   GERD / Tobacco Use Disorder Provide PPI and H2B, then reassess  Disposition / Discharge Recommendations No adverse events reported overnight.  Vital signs remain stable and patient afebrile. Patient was provided IV magnesium and diazepam for treatment of muscle cramps.  Cardiac workup was reassuring, negative for MI and nuclear medicine stress test negative for inducible ischemia.  CT Abdomen showed specific bladder wall thickening and known diverticulosis but no diverticulitis.  Patient denies any dysuric symptoms confirms using Flomax as prescribed with good else.  Patient remains hemodynamically stable and is clinically ready for discharge home.  Results of workup reviewed with Mr. Lienhard and all questions answered.  Patient confirms alcohol cessation for 26-months now. He does report some symptoms of GERD, he continues to smoke and on imaging diverticulosis was reported (patient was aware of this). He was provided PPI and H2B with some relief of symptoms. A prescription for Protonix has been electronically sent to his preferred pharmacy.  Patient was asked to pick this up and take as prescribed.  Patient was also asked to follow-up with his PCP within 3 to 5 days of discharge and to return to the ED should he have any worsening or new symptoms/needs.  Patient stated understanding with plan for follow-up care and discharge home today.   Clinical Impression 1. Muscle cramping   2. Chest discomfort    Discharge Medication List as of 08/02/2024  2:15 PM     START taking these medications   Details  pantoprazole (PROTONIX) 40 mg EC tablet Take 1 tablet (40 mg total) by mouth every morning before breakfast., Starting Tue 08/02/2024, Until Thu 09/01/2024, Normal       Outpatient Follow-up Recommendations: Alan ONEIDA Waddell DEVONNA [address] [place] [address] [phone]  Schedule an appointment as soon as possible for a visit in 5 days   [facility] -  EMERGENCY DEPARTMENT [address] [place]  [address] [phone] Go to  As needed   Provider time spent in patient care today, inclusive of but not limited to clinical reassessment, review of diagnostic studies, and discharge preparation, was greater than 30 minutes.   Chart completed using Dragon Voice Dictation       [1] Past Medical History: Diagnosis Date   Bipolar 1 disorder    (CMD)    Chronic constipation    Chronic diarrhea    COPD (chronic obstructive pulmonary disease)    (CMD)    Dyslipidemia    GERD (gastroesophageal reflux disease)    Headache(784.0)    Primary hypertension 05/05/2022   Syncope and collapse   [2] Family History Problem Relation Name Age of Onset   Aneurysm Mother     Stroke Mother     Esophageal cancer Father     Heart disease Father     Migraines Sister     Parkinsonism Neg Hx     Seizures Neg Hx     Ataxia Neg Hx     Ankylosing spondylitis Neg Hx     Rheum arthritis Neg Hx     Gout Neg Hx     Psoriasis Neg Hx     Lupus Neg Hx    [3] Social History Socioeconomic History   Marital status: Married  Tobacco Use   Smoking status: Every Day    Current packs/day: 2.00    Types: Cigarettes   Smokeless tobacco: Never  Substance and Sexual Activity   Alcohol use: Yes    Alcohol/week: 2.0 - 3.0 standard drinks of alcohol    Types: 2 - 3 Cans of beer per week    Comment: nightly   Drug use: No   Social Drivers of Health   Food Insecurity: Low Risk  (12/29/2023)   Food vital sign    Within the past 12 months, you worried that your food would run out before you got money to buy more: Never true    Within the past 12 months, the food you bought just didn't last and you didn't have money to get more: Never true  Transportation Needs: No Transportation Needs (12/29/2023)   Transportation    In the past 12 months, has lack of reliable transportation kept you from  medical appointments, meetings, work or from getting things needed for daily living? : No  Safety: High Risk (12/29/2023)   Safety    How often does anyone, including family and friends, physically hurt you?:  Never    How often does anyone, including family and friends, insult or talk down to you?: Rarely    How often does anyone, including family and friends, threaten you with harm?: Rarely    How often does anyone, including family and friends, scream or curse at you?: Rarely  Living Situation: Medium Risk (12/29/2023)   Living Situation    What is your living situation today?: I have a place to live today, but I am worried about losing it in the future    Think about the place you live. Do you have problems with any of the following? Choose all that apply:: None/None on this list

## 2024-11-15 NOTE — Progress Notes (Deleted)
" ° ° °  Chief Complaint:    History of Present Illness:     Past Medical History:  Past Medical History:  Diagnosis Date   Appendicitis     Past Surgical History:  Past Surgical History:  Procedure Laterality Date   APPENDECTOMY     KNEE ARTHROSCOPY Right x 2    Allergies:  Allergies[1]  Family History:  Family History  Problem Relation Age of Onset   Heart attack Father    Diabetes Sister    Hyperlipidemia Brother     Social History:  Social History[2]  Review of symptoms:  Constitutional:  Negative for unexplained weight loss, night sweats, fever, chills ENT:  Negative for nose bleeds, sinus pain, painful swallowing CV:  Negative for chest pain, shortness of breath, exercise intolerance, palpitations, loss of consciousness Resp:  Negative for cough, wheezing, shortness of breath GI:  Negative for nausea, vomiting, diarrhea, bloody stools GU:  Positives noted in HPI; otherwise negative for gross hematuria, dysuria, urinary incontinence Neuro:  Negative for seizures, poor balance, limb weakness, slurred speech Psych:  Negative for lack of energy, depression, anxiety Endocrine:  Negative for polydipsia, polyuria, symptoms of hypoglycemia (dizziness, hunger, sweating) Hematologic:  Negative for anemia, purpura, petechia, prolonged or excessive bleeding, use of anticoagulants  Allergic:  Negative for difficulty breathing or choking as a result of exposure to anything; no shellfish allergy; no allergic response (rash/itch) to materials, foods  Physical exam: There were no vitals taken for this visit. GENERAL APPEARANCE:  Well appearing, well developed, well nourished, NAD HEENT: Atraumatic, Normocephalic. NECK: Normal appearance LUNGS: Normal inspiratory and expiratory excursion HEART: Regular Rate ABDOMEN: ***. GU: Phallus normal, no lesions. Scrotal skin normal. Testicles/epididymal structures normal. Meatus normal. Normal anal sphincter tone, prostate ***mL,  symmetric, non nodular, non tender. EXTREMITIES: Moves all extremities well.  Without clubbing, cyanosis, or edema. NEUROLOGIC:  Alert and oriented x 3, normal gait, CN II-XII grossly intact.  MENTAL STATUS:  Appropriate. SKIN:  Warm, dry and intact.    Results: No results found for this or any previous visit (from the past 24 hours).  I have reviewed referring/prior physicians notes  I have reviewed urinalysis  I have reviewed PSA results  I have reviewed prior imaging-patient CT pelvis in September with [facility].  Findings: 1.  Nonspecific bladder wall thickening. Correlate with urinalysis to assess for acute cystitis.  2.  Otherwise, no acute abdominopelvic abnormality.  3.  Chronic ancillary findings as above.   I have reviewed urine culture results  Assessment: ***   Plan: ***     [1] No Known Allergies [2]  Social History Tobacco Use   Smoking status: Every Day   "

## 2024-11-16 ENCOUNTER — Ambulatory Visit: Payer: Self-pay | Admitting: Urology

## 2024-11-16 DIAGNOSIS — R9341 Abnormal radiologic findings on diagnostic imaging of renal pelvis, ureter, or bladder: Secondary | ICD-10-CM
